# Patient Record
Sex: Male | Born: 1960 | Hispanic: Yes | Marital: Married | Smoking: Never smoker
Health system: Southern US, Community
[De-identification: ages and names within clinical notes are randomized; demographics above are authoritative.]

## PROBLEM LIST (undated history)

## (undated) ENCOUNTER — Ambulatory Visit: Admission: EM | Source: Home / Self Care

## (undated) DIAGNOSIS — G43909 Migraine, unspecified, not intractable, without status migrainosus: Secondary | ICD-10-CM

## (undated) DIAGNOSIS — I10 Essential (primary) hypertension: Secondary | ICD-10-CM

## (undated) HISTORY — DX: Migraine, unspecified, not intractable, without status migrainosus: G43.909

## (undated) HISTORY — DX: Essential (primary) hypertension: I10

---

## 2011-03-04 ENCOUNTER — Emergency Department: Payer: Self-pay | Admitting: Emergency Medicine

## 2014-12-25 ENCOUNTER — Ambulatory Visit: Payer: Self-pay | Admitting: Surgery

## 2015-11-26 IMAGING — US US EXTREM NON VASC*R* COMPLETE
1 series · 13 of 13 positions shown · non-contrast
Comparison: None.

CLINICAL DATA: Clinical right axillary lymphadenopathy ; evaluate
for soft tissue mass. ; history of infection in the muscles of the
upper right arm tube month ago treated with antibiotics

EXAM:
US EXTREM NON VASC*R* COMPLETE
TECHNIQUE: Grayscale techniques were employed to evaluate the soft tissues the
right axillary region.

[Series 1: us extrem non vasc*right* complete · 0.08mm/px · 13 of 13 slices shown]
[im 1/13]
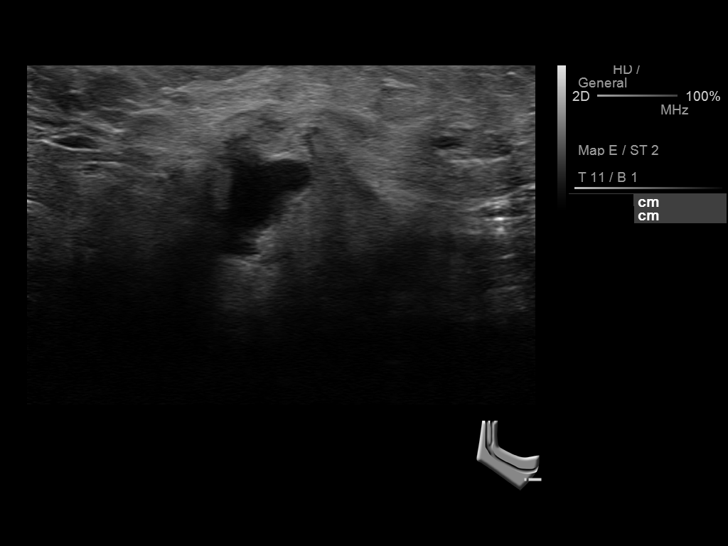
[im 2/13]
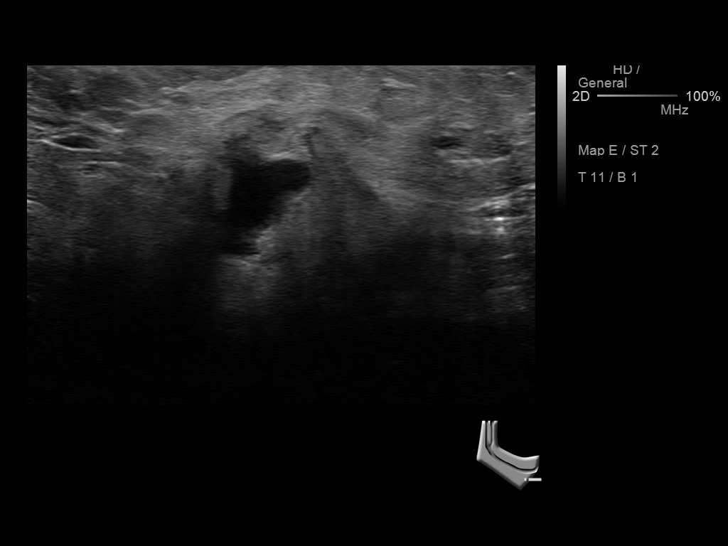
[im 3/13]
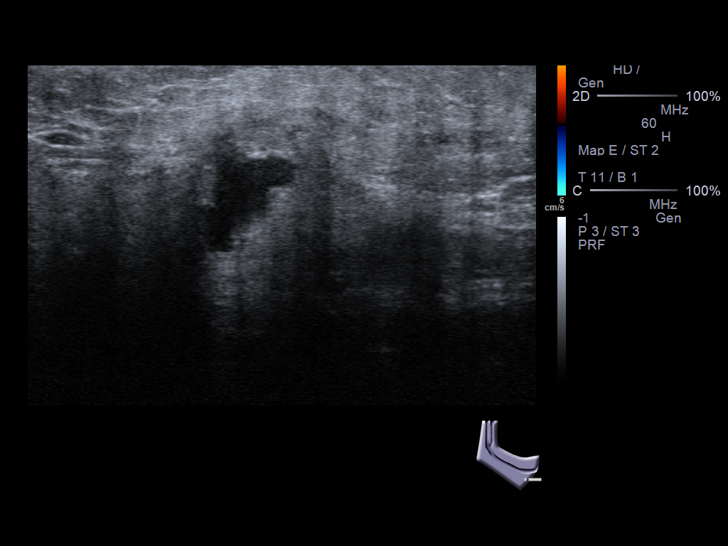
[im 4/13]
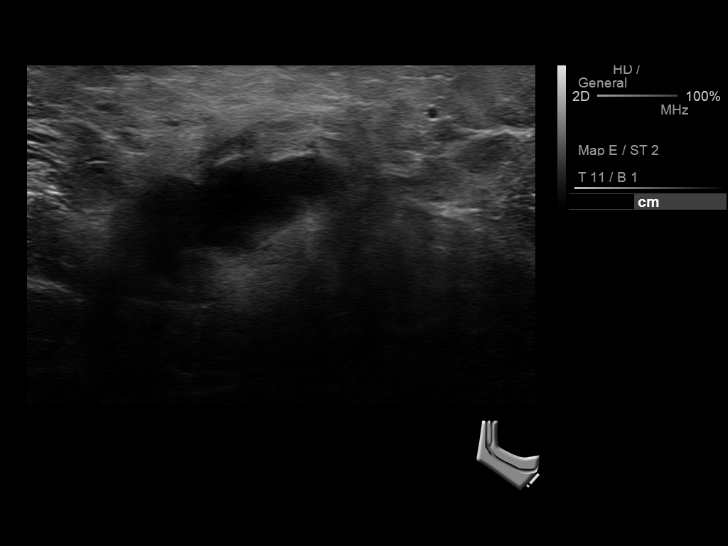
[im 5/13]
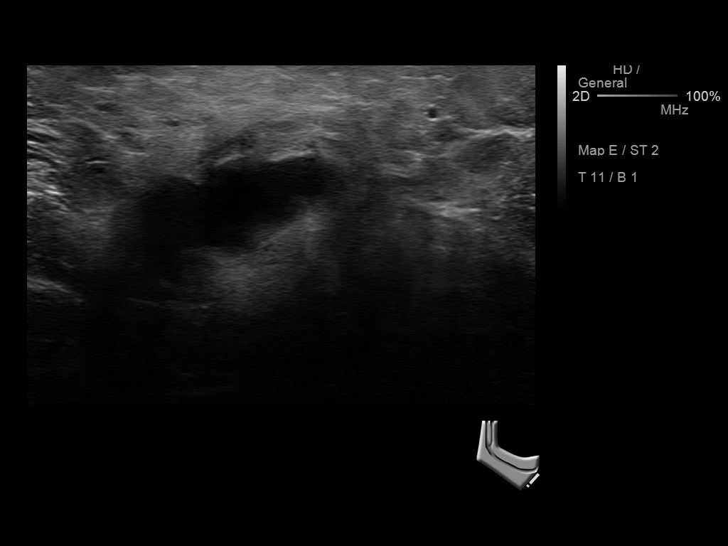
[im 6/13]
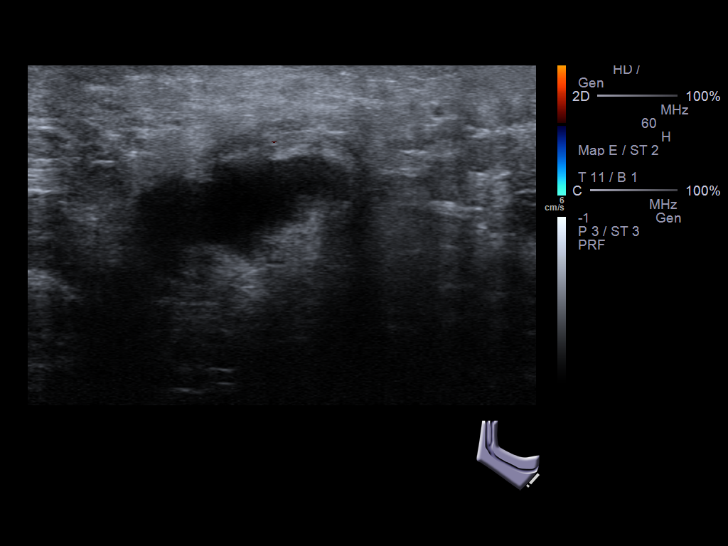
[im 7/13]
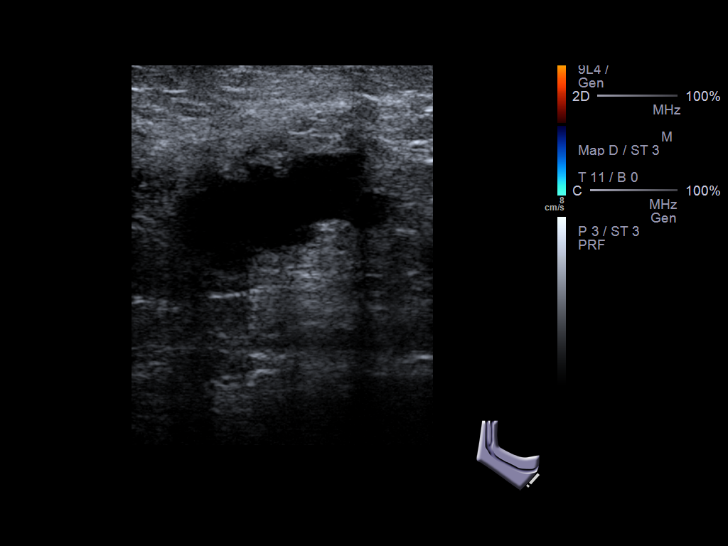
[im 8/13]
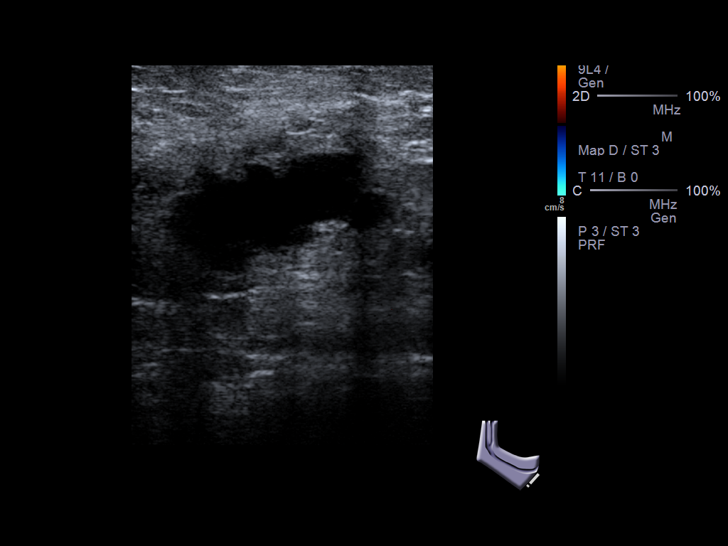
[im 9/13]
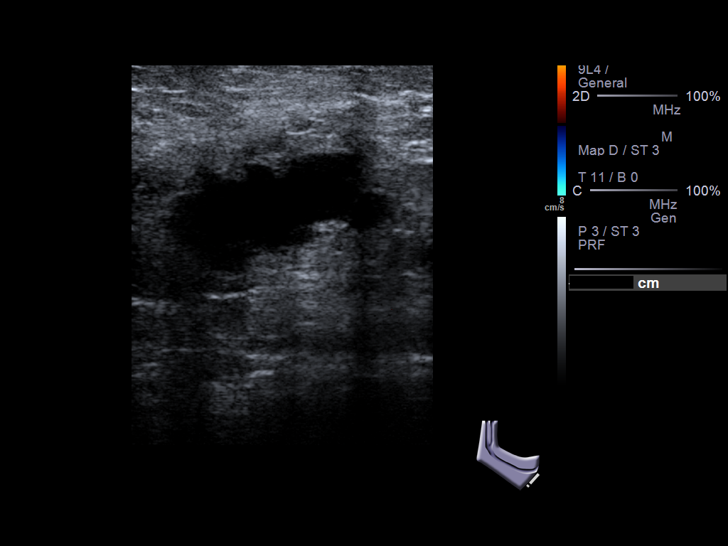
[im 10/13]
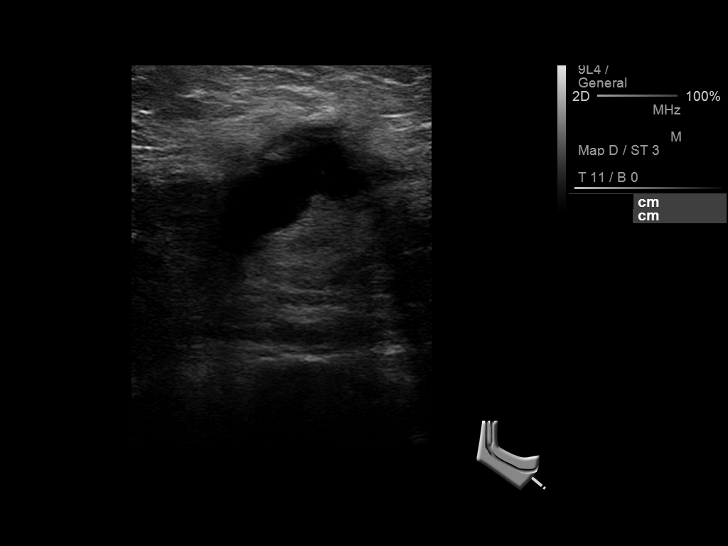
[im 11/13]
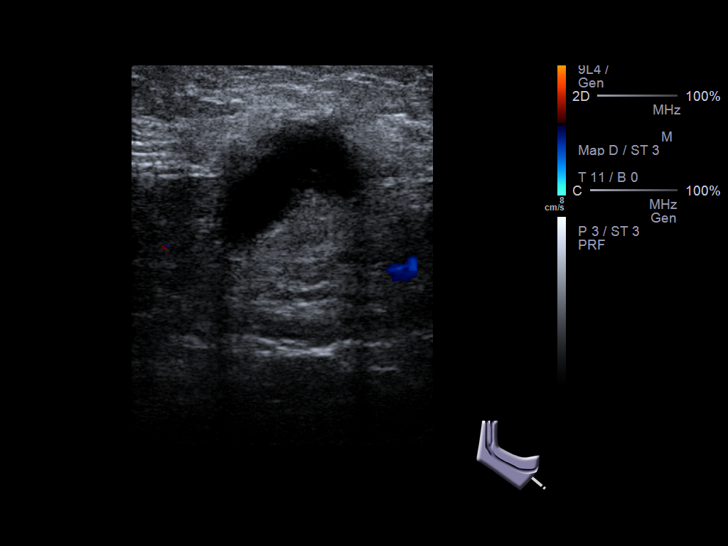
[im 12/13]
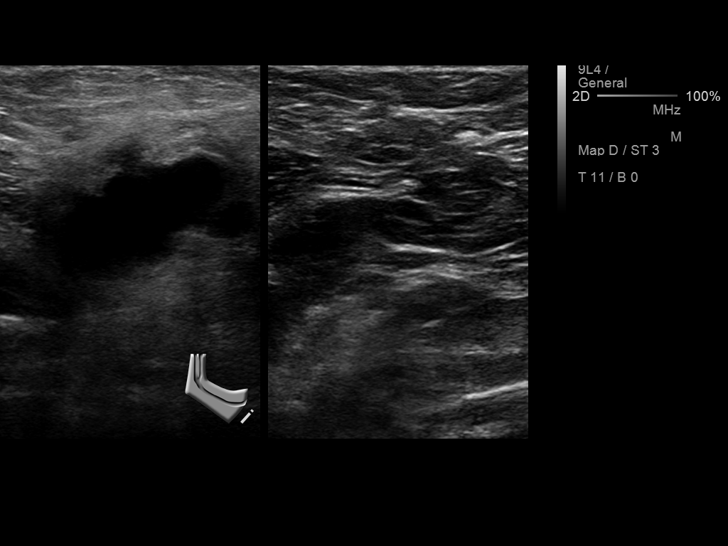
[im 13/13]
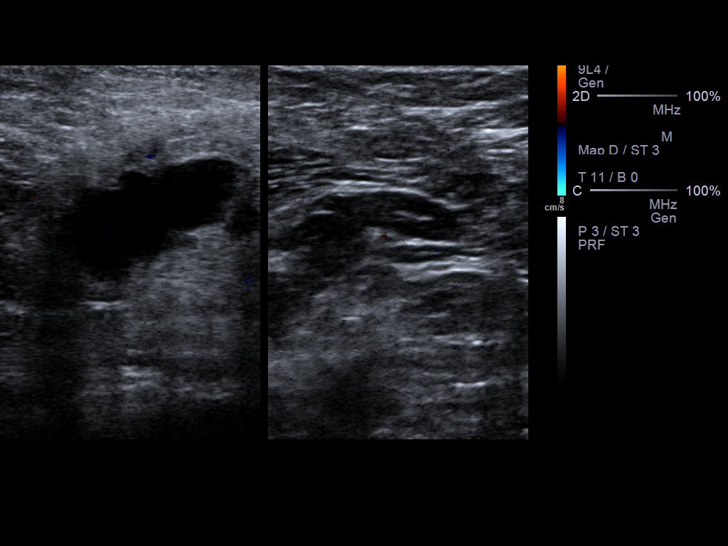

[13 of 13 positions shown; findings below may reference images not displayed]

FINDINGS: There is an irregularly marginated anechoic focus in the right
axilla exhibiting enhanced through transmission. This is deep to the
area of clinical swelling. It measures 1.9 x 1.7 x 2.3 cm. Increased
echotexture surrounding the structure is present. No internal echoes
are demonstrated.
IMPRESSION: Anechoic structure consistent with a fluid collection in the right
axilla. This likely reflects a seroma or old hematoma. No
suspicious-appearing solid masses are demonstrated.

## 2020-09-13 ENCOUNTER — Encounter: Payer: Self-pay | Admitting: Emergency Medicine

## 2020-09-13 ENCOUNTER — Emergency Department: Payer: 59

## 2020-09-13 ENCOUNTER — Emergency Department
Admission: EM | Admit: 2020-09-13 | Discharge: 2020-09-14 | Disposition: A | Discharge status: Home / Self Care | Payer: 59 | Attending: Emergency Medicine | Admitting: Emergency Medicine

## 2020-09-13 ENCOUNTER — Other Ambulatory Visit: Payer: Self-pay

## 2020-09-13 DIAGNOSIS — R001 Bradycardia, unspecified: Secondary | ICD-10-CM | POA: Insufficient documentation

## 2020-09-13 DIAGNOSIS — R079 Chest pain, unspecified: Secondary | ICD-10-CM | POA: Diagnosis not present

## 2020-09-13 DIAGNOSIS — R739 Hyperglycemia, unspecified: Secondary | ICD-10-CM | POA: Diagnosis not present

## 2020-09-13 DIAGNOSIS — I159 Secondary hypertension, unspecified: Secondary | ICD-10-CM

## 2020-09-13 LAB — BASIC METABOLIC PANEL
Anion gap: 13 (ref 5–15)
BUN: 17 mg/dL (ref 6–20)
CO2: 24 mmol/L (ref 22–32)
Calcium: 8.9 mg/dL (ref 8.9–10.3)
Chloride: 99 mmol/L (ref 98–111)
Creatinine, Ser: 0.97 mg/dL (ref 0.61–1.24)
GFR calc Af Amer: >60 mL/min (ref >60)
GFR calc non Af Amer: >60 mL/min (ref >60)
Glucose, Bld: 172 mg/dL — ABNORMAL HIGH (ref 70–99)
Potassium: 3.2 mmol/L — ABNORMAL LOW (ref 3.5–5.1)
Sodium: 136 mmol/L (ref 135–145)

## 2020-09-13 LAB — CBC
HCT: 43.6 % (ref 39.0–52.0)
Hemoglobin: 14.8 g/dL (ref 13.0–17.0)
MCH: 30.6 pg (ref 26.0–34.0)
MCHC: 33.9 g/dL (ref 30.0–36.0)
MCV: 90.3 fL (ref 80.0–100.0)
Platelets: 203 10*3/uL (ref 150–400)
RBC: 4.83 MIL/uL (ref 4.22–5.81)
RDW: 13.6 % (ref 11.5–15.5)
WBC: 6.7 10*3/uL (ref 4.0–10.5)
nRBC: 0 % (ref 0.0–0.2)

## 2020-09-13 LAB — TROPONIN I (HIGH SENSITIVITY)
Troponin I (High Sensitivity): 3 ng/L (ref <18)
Troponin I (High Sensitivity): 5 ng/L (ref <18)

## 2020-09-13 MED ORDER — POTASSIUM CHLORIDE CRYS ER 20 MEQ PO TBCR
40.0000 meq | EXTENDED_RELEASE_TABLET | Freq: Once | ORAL | Status: AC
  Administered 2020-09-14: 40 meq via ORAL
  Filled 2020-09-13: qty 2

## 2020-09-13 MED ORDER — LACTATED RINGERS IV BOLUS
1000.0000 mL | Freq: Once | INTRAVENOUS | Status: AC
  Administered 2020-09-14: 1000 mL via INTRAVENOUS

## 2020-09-13 NOTE — ED Provider Notes (Signed)
[facility] Emergency Department Provider Note  ____________________________________________   First MD Initiated Contact with Patient 09/13/20 2339     (approximate)  I have reviewed the triage vital signs and the nursing notes.   HISTORY  Chief Complaint Chest Pain   HPI Steven Baldwin is a 59 y.o. male without significant past medical history including no history of CAD, HTN, HDL, DM, tobacco abuse, or strong family history of CAD presents for assessment of approximately 2 months of intermittent left-sided chest pain.  Patient states this chest pain occurs every 3 to 4 days.  It is not exertional or positional and does occur at rest and when he is moving.  He denies any clear alleviating or aggravating factors other than aspirin which he states he will take sometimes when he has the pain and it seems to improve the pain.  He states that the pain that he felt today since the medication his usual but that it woke him out of sleep this morning.  He denies any other acute symptoms including headache, earache, sore throat, cough, shortness of breath, back pain, extremity pain, diaphoresis, nausea, vomiting, diarrhea, dysuria, abdominal pain, extremity pain, rash or other acute complaints.  Denies tobacco abuse, illegal drug use, or EtOH abuse.         History reviewed. No pertinent past medical history.  There are no problems to display for this patient.   History reviewed. No pertinent surgical history.  Prior to Admission medications   Not on File    Allergies Patient has no known allergies.  History reviewed. No pertinent family history.  Social History Social History   Tobacco Use  . Smoking status: Never Smoker  . Smokeless tobacco: Never Used  Substance Use Topics  . Alcohol use: Never  . Drug use: Never    Review of Systems  Review of Systems  Constitutional: Negative for chills and fever.  HENT: Negative for sore throat.     Eyes: Negative for pain.  Respiratory: Negative for cough and stridor.   Cardiovascular: Positive for chest pain.  Gastrointestinal: Negative for vomiting.  Skin: Negative for rash.  Neurological: Negative for seizures, loss of consciousness and headaches.  Psychiatric/Behavioral: Negative for suicidal ideas.  All other systems reviewed and are negative.     ____________________________________________   PHYSICAL EXAM:  VITAL SIGNS: ED Triage Vitals  Enc Vitals Group     BP 09/13/20 1639 135/74     Pulse Rate 09/13/20 1639 (!) 50     Resp 09/13/20 1639 16     Temp 09/13/20 1639 97.9 F (36.6 C)     Temp Source 09/13/20 1639 Oral     SpO2 09/13/20 1639 100 %     Weight 09/13/20 1638 180 lb (81.6 kg)     Height 09/13/20 1638 5\' 5"  (1.651 m)     Head Circumference --      Peak Flow --      Pain Score 09/13/20 1653 6     Pain Loc --      Pain Edu? --      Excl. in GC? --    Vitals:   09/13/20 2153 09/14/20 0024  BP: (!) 155/77 (!) 145/99  Pulse: (!) 49 (!) 43  Resp: 17 13  Temp: 98.1 F (36.7 C)   SpO2: 99% 98%   Physical Exam Vitals and nursing note reviewed.  Constitutional:      Appearance: He is well-developed.  HENT:  Head: Normocephalic and atraumatic.     Right Ear: External ear normal.     Left Ear: External ear normal.     Nose: Nose normal.     Mouth/Throat:     Mouth: Mucous membranes are moist.  Eyes:     Conjunctiva/sclera: Conjunctivae normal.  Cardiovascular:     Rate and Rhythm: Regular rhythm. Bradycardia present.     Heart sounds: No murmur heard.   Pulmonary:     Effort: Pulmonary effort is normal. No respiratory distress.     Breath sounds: Normal breath sounds.  Abdominal:     Palpations: Abdomen is soft.     Tenderness: There is no abdominal tenderness.  Musculoskeletal:     Cervical back: Neck supple.     Right lower leg: No edema.     Left lower leg: No edema.  Skin:    General: Skin is warm and dry.     Capillary  Refill: Capillary refill takes less than 2 seconds.  Neurological:     Mental Status: He is alert and oriented to person, place, and time.  Psychiatric:        Mood and Affect: Mood normal.      ____________________________________________   LABS (all labs ordered are listed, but only abnormal results are displayed)  Labs Reviewed  BASIC METABOLIC PANEL - Abnormal; Notable for the following components:      Result Value   Potassium 3.2 (*)    Glucose, Bld 172 (*)    All other components within normal limits  CBC  TSH  FIBRIN DERIVATIVES D-DIMER ([facility] ONLY)  HEMOGLOBIN A1C  TROPONIN I (HIGH SENSITIVITY)  TROPONIN I (HIGH SENSITIVITY)   ____________________________________________  EKG  Sinus bradycardia with a ventricular rate of 52, normal axis, prolonged PR interval at 202 representing first-degree block with otherwise unremarkable intervals and no clear evidence of acute ischemia or other significant underlying arrhythmia. ____________________________________________  RADIOLOGY  ED MD interpretation: No focal consolidation, effusion, no thorax, edema, or other acute thoracic process.  Official radiology report(s): DG Chest 2 View  Result Date: 09/13/2020 CLINICAL DATA:  Left-sided chest pain EXAM: CHEST - 2 VIEW COMPARISON:  None. FINDINGS: No focal consolidation. No pleural effusion or pneumothorax. Heart and mediastinal contours are unremarkable. No acute osseous abnormality. IMPRESSION: No acute cardiopulmonary disease. Electronically Signed   By: Elige Ko   On: 09/13/2020 17:45    ____________________________________________   PROCEDURES  Procedure(s) performed (including Critical Care):  .1-3 Lead EKG Interpretation Performed by: Gilles Chiquito, MD Authorized by: Gilles Chiquito, MD     Interpretation: abnormal     ECG rate assessment: bradycardic     Rhythm: A-V block     Ectopy: none     Conduction: abnormal     Abnormal conduction: 1st  degree AV block       ____________________________________________   INITIAL IMPRESSION / ASSESSMENT AND PLAN / ED COURSE        Patient presents with [place] to history exam for assessment of some chest pain that has been intermittent over the last 2 months.  Patient is noted to be bradycardic and slightly hypertensive with otherwise stable vital signs on arrival.  Differential includes ACS, arrhythmia, PE, pneumonia, GERD, chest wall etiologies including costochondritis and musculoskeletal strain, metabolic derangement, myocarditis, pericarditis, metabolic derangement.  Low suspicion for ACS at this time given no clear acute ischemic changes on ECG with 2 nonelevated troponins obtained over 2 hours.  Patient does not have a  first-degree AV block and bradycardia I suspect this is not related to patient's chest pain and not related to acute ischemia.  This is likely symptomatic patient denies any headache, lightheadedness, shortness of breath, or other symptoms could be related to his bradycardia.  Chest x-ray shows no evidence of pneumonia or other acute intrathoracic process.  D-dimer is less than 500 I will suspicion for PE at this time.  Patient is noted be hyperglycemic but no other significant metabolic or electrolyte derangements noted.  CBC is unremarkable.  Presentation work-up is not consistent with pericarditis, myocarditis, dissection, or reflux.  Patient was hydrated and given potassium.  On my assessment he stated he felt much better and is possible that mild dehydration and hypokalemia may have contributed to muscle spasm.  Emphasized to the patient my concerns for his high blood pressure and hyperglycemia today and I did instruct him that I sent an A1c test to test for diabetes but that this must be followed up by his PCP.  Instructed patient that he must follow-up with his PCP in the next 3 to 5 days and should return immediately to the emergency room should he experience any new or  acute worsening of his symptoms.  Given patient stating his chest pain had resolved and he assessment with otherwise stable vital signs and reassuring exam I do believe he is safe for discharge with plan for close outpatient follow-up.  Discharged stable condition.  Return precautions advised and discussed.   ____________________________________________   FINAL CLINICAL IMPRESSION(S) / ED DIAGNOSES  Final diagnoses:  Chest pain, unspecified type  Hyperglycemia  Secondary hypertension  Bradycardia    Medications  lactated ringers bolus 1,000 mL (1,000 mLs Intravenous New Bag/Given 09/14/20 0019)  potassium chloride SA (KLOR-CON) CR tablet 40 mEq (40 mEq Oral Given 09/14/20 0020)     ED Discharge Orders    None       Note:  This document was prepared using Dragon voice recognition software and may include unintentional dictation errors.   Gilles Chiquito, MD 09/14/20 [phone]

## 2020-09-13 NOTE — ED Triage Notes (Signed)
Here for left sided chest pain.  Pt reports pain woke him out of his sleep this AM.  Pt does not have regular doctor, unsure of baseline HR.  Describes as pressure or swelling to left chest.  Intermittent for 2 months.  Reports improvement with aspirin.

## 2020-09-14 ENCOUNTER — Other Ambulatory Visit: Payer: Self-pay

## 2020-09-14 LAB — HEMOGLOBIN A1C
Hgb A1c MFr Bld: 5.5 % (ref 4.8–5.6)
Mean Plasma Glucose: 111.15 mg/dL

## 2020-09-14 LAB — TSH: TSH: 1.809 u[IU]/mL (ref 0.350–4.500)

## 2020-09-14 LAB — FIBRIN DERIVATIVES D-DIMER (ARMC ONLY): Fibrin derivatives D-dimer (ARMC): 277.24 ng/mL (FEU) (ref 0.00–499.00)

## 2020-09-14 NOTE — ED Notes (Signed)
AVS provided in spanish and english per the pt's request

## 2022-06-06 ENCOUNTER — Ambulatory Visit (INDEPENDENT_AMBULATORY_CARE_PROVIDER_SITE_OTHER): Payer: 59 | Admitting: Internal Medicine

## 2022-06-06 ENCOUNTER — Encounter: Payer: Self-pay | Admitting: Internal Medicine

## 2022-06-06 ENCOUNTER — Ambulatory Visit: Payer: Self-pay | Admitting: Internal Medicine

## 2022-06-06 VITALS — BP 147/82 | HR 54 | Ht 67.0 in | Wt 185.5 lb

## 2022-06-06 DIAGNOSIS — G4733 Obstructive sleep apnea (adult) (pediatric): Secondary | ICD-10-CM | POA: Diagnosis not present

## 2022-06-06 DIAGNOSIS — E8881 Metabolic syndrome: Secondary | ICD-10-CM

## 2022-06-06 DIAGNOSIS — E669 Obesity, unspecified: Secondary | ICD-10-CM

## 2022-06-06 DIAGNOSIS — I10 Essential (primary) hypertension: Secondary | ICD-10-CM

## 2022-06-06 HISTORY — DX: Metabolic syndrome: E88.810

## 2022-06-06 MED ORDER — LOSARTAN POTASSIUM 50 MG PO TABS: 50.0000 mg | ORAL_TABLET | Freq: Every day | ORAL | 2 refills | Status: DC

## 2022-06-06 NOTE — Progress Notes (Signed)
New Patient Office Visit  Subjective:  Patient ID: Steven Baldwin, male    DOB: Nov 04, 1961  Age: 61 y.o. MRN: [id number]  CC:  Chief Complaint  Patient presents with   New Patient (Initial Visit)    HPI Patient presents for new pt visit  Past Medical History:  Diagnosis Date   Hypertension    Migraine headache     No current outpatient medications on file.   History reviewed. No pertinent surgical history.  Family History  Problem Relation Age of Onset   Hypertension Mother    Kidney disease Father    Hypertension Sister     Social History   Socioeconomic History   Marital status: Married    Spouse name: Not on file   Number of children: 3   Years of education: Not on file   Highest education level: Not on file  Occupational History   Not on file  Tobacco Use   Smoking status: Never   Smokeless tobacco: Never  Substance and Sexual Activity   Alcohol use: Never   Drug use: Never   Sexual activity: Not Currently    Birth control/protection: None  Other Topics Concern   Not on file  Social History Narrative   Not on file   Social Determinants of Health   Financial Resource Strain: Not on file  Food Insecurity: Not on file  Transportation Needs: Not on file  Physical Activity: Not on file  Stress: Not on file  Social Connections: Not on file  Intimate Partner Violence: Not on file    ROS Review of Systems  Constitutional: Negative.   HENT: Negative.    Eyes: Negative.   Respiratory: Negative.    Cardiovascular: Negative.   Gastrointestinal: Negative.   Endocrine: Negative.   Genitourinary: Negative.   Musculoskeletal: Negative.   Skin: Negative.   Allergic/Immunologic: Negative.   Neurological: Negative.   Hematological: Negative.   Psychiatric/Behavioral: Negative.    All other systems reviewed and are negative.   Objective:   Today's Vitals: BP (!) 147/82   Pulse (!) 54   Ht 5\' 7"  (1.702 m)   Wt 185 lb 8 oz (84.1 kg)   BMI  29.05 kg/m   Physical Exam Vitals reviewed.  Constitutional:      Appearance: Normal appearance. He is obese.  HENT:     Head: Normocephalic.     Nose: Nose normal.  Eyes:     Pupils: Pupils are equal, round, and reactive to light.  Cardiovascular:     Rate and Rhythm: Normal rate and regular rhythm.     Heart sounds: No murmur heard. Pulmonary:     Effort: Pulmonary effort is normal.  Abdominal:     General: Bowel sounds are normal.     Palpations: Abdomen is soft.  Musculoskeletal:        General: Normal range of motion.     Cervical back: Normal range of motion.  Skin:    General: Skin is warm.  Neurological:     General: No focal deficit present.     Mental Status: He is alert.     Assessment & Plan:   Problem List Items Addressed This Visit       Cardiovascular and Mediastinum   Primary hypertension - Primary    The following hypertensive lifestyle modification were recommended and discussed:  1. Limiting alcohol intake to less than 1 oz/day of ethanol:(24 oz of beer or 8 oz of wine or 2 oz of 100-proof whiskey).  2. Take baby ASA 81 mg daily. 3. Importance of regular aerobic exercise and losing weight. 4. Reduce dietary saturated fat and cholesterol intake for overall cardiovascular health. 5. Maintaining adequate dietary potassium, calcium, and magnesium intake. 6. Regular monitoring of the blood pressure. 7. Reduce sodium intake to less than 100 mmol/day (less than 2.3 gm of sodium or less than 6 gm of sodium choride)         Respiratory   OSA (obstructive sleep apnea)    Patient was suggested to have a sleep study done.  We will do a complete lab test PSA and thyroid check next week        Other   Abdominal obesity and metabolic syndrome    - I encouraged the patient to lose weight.  - I educated them on making healthy dietary choices including eating more fruits and vegetables and less fried foods. - I encouraged the patient to exercise more, and  educated on the benefits of exercise including weight loss, diabetes prevention, and hypertension prevention.   Dietary counseling with a registered dietician  Referral to a weight management support group (e.g. Weight Watchers, Overeaters Anonymous)  If your BMI is greater than 29 or you have gained more than 15 pounds you should work on weight loss.  Attend a healthy cooking class        No outpatient encounter medications on file as of 06/06/2022.   No facility-administered encounter medications on file as of 06/06/2022.    Follow-up: No follow-ups on file.   Corky Downs, MD

## 2022-06-06 NOTE — Assessment & Plan Note (Signed)

## 2022-06-06 NOTE — Assessment & Plan Note (Signed)

## 2022-06-06 NOTE — Assessment & Plan Note (Signed)
Patient was suggested to have a sleep study done.  We will do a complete lab test PSA and thyroid check next week

## 2022-06-07 ENCOUNTER — Ambulatory Visit: Payer: 59 | Admitting: Internal Medicine

## 2022-06-07 ENCOUNTER — Ambulatory Visit (INDEPENDENT_AMBULATORY_CARE_PROVIDER_SITE_OTHER): Payer: 59

## 2022-06-07 DIAGNOSIS — I10 Essential (primary) hypertension: Secondary | ICD-10-CM

## 2022-06-07 DIAGNOSIS — E8881 Metabolic syndrome: Secondary | ICD-10-CM

## 2022-06-07 DIAGNOSIS — E669 Obesity, unspecified: Secondary | ICD-10-CM

## 2022-06-08 LAB — CBC WITH DIFFERENTIAL/PLATELET
Absolute Monocytes: 459 cells/uL (ref 200–950)
Basophils Absolute: 39 cells/uL (ref 0–200)
Basophils Relative: 0.7 %
Eosinophils Absolute: 101 cells/uL (ref 15–500)
Eosinophils Relative: 1.8 %
HCT: 45.9 % (ref 38.5–50.0)
Hemoglobin: 15.5 g/dL (ref 13.2–17.1)
Lymphs Abs: 2089 cells/uL (ref 850–3900)
MCH: 31.1 pg (ref 27.0–33.0)
MCHC: 33.8 g/dL (ref 32.0–36.0)
MCV: 92 fL (ref 80.0–100.0)
MPV: 11.1 fL (ref 7.5–12.5)
Monocytes Relative: 8.2 %
Neutro Abs: 2912 cells/uL (ref 1500–7800)
Neutrophils Relative %: 52 %
Platelets: 223 10*3/uL (ref 140–400)
RBC: 4.99 10*6/uL (ref 4.20–5.80)
RDW: 13.4 % (ref 11.0–15.0)
Total Lymphocyte: 37.3 %
WBC: 5.6 10*3/uL (ref 3.8–10.8)

## 2022-06-08 LAB — LIPID PANEL
Cholesterol: 172 mg/dL (ref <200)
HDL: 32 mg/dL — ABNORMAL LOW (ref >=40)
LDL Cholesterol (Calc): 111 mg/dL (calc) — ABNORMAL HIGH
Non-HDL Cholesterol (Calc): 140 mg/dL (calc) — ABNORMAL HIGH (ref <130)
Total CHOL/HDL Ratio: 5.4 (calc) — ABNORMAL HIGH (ref <5.0)
Triglycerides: 172 mg/dL — ABNORMAL HIGH (ref <150)

## 2022-06-08 LAB — COMPLETE METABOLIC PANEL WITH GFR
AG Ratio: 1.5 (calc) (ref 1.0–2.5)
ALT: 18 U/L (ref 9–46)
AST: 20 U/L (ref 10–35)
Albumin: 4.3 g/dL (ref 3.6–5.1)
Alkaline phosphatase (APISO): 63 U/L (ref 35–144)
BUN: 15 mg/dL (ref 7–25)
CO2: 25 mmol/L (ref 20–32)
Calcium: 8.9 mg/dL (ref 8.6–10.3)
Chloride: 105 mmol/L (ref 98–110)
Creat: 0.98 mg/dL (ref 0.70–1.35)
Globulin: 2.8 g/dL (calc) (ref 1.9–3.7)
Glucose, Bld: 99 mg/dL (ref 65–99)
Potassium: 4.2 mmol/L (ref 3.5–5.3)
Sodium: 139 mmol/L (ref 135–146)
Total Bilirubin: 0.6 mg/dL (ref 0.2–1.2)
Total Protein: 7.1 g/dL (ref 6.1–8.1)
eGFR: 88 mL/min/{1.73_m2} (ref >=60)

## 2022-06-08 LAB — PSA: PSA: 0.97 ng/mL (ref <=4.00)

## 2022-06-08 LAB — TSH: TSH: 1.86 mIU/L (ref 0.40–4.50)

## 2022-06-08 LAB — SPECIMEN COMPROMISED

## 2022-06-14 ENCOUNTER — Encounter: Payer: Self-pay | Admitting: Internal Medicine

## 2022-06-14 ENCOUNTER — Ambulatory Visit (INDEPENDENT_AMBULATORY_CARE_PROVIDER_SITE_OTHER): Payer: 59 | Admitting: Internal Medicine

## 2022-06-14 VITALS — BP 120/77 | HR 60 | Ht 67.0 in | Wt 184.7 lb

## 2022-06-14 DIAGNOSIS — G4733 Obstructive sleep apnea (adult) (pediatric): Secondary | ICD-10-CM

## 2022-06-14 DIAGNOSIS — E8881 Metabolic syndrome: Secondary | ICD-10-CM | POA: Diagnosis not present

## 2022-06-14 DIAGNOSIS — G43709 Chronic migraine without aura, not intractable, without status migrainosus: Secondary | ICD-10-CM

## 2022-06-14 DIAGNOSIS — I10 Essential (primary) hypertension: Secondary | ICD-10-CM

## 2022-06-14 DIAGNOSIS — E669 Obesity, unspecified: Secondary | ICD-10-CM

## 2022-06-14 DIAGNOSIS — Z1211 Encounter for screening for malignant neoplasm of colon: Secondary | ICD-10-CM

## 2022-06-14 NOTE — Addendum Note (Signed)
Addended by: Jobie Quaker on: 06/14/2022 09:46 AM   Modules accepted: Orders

## 2022-06-14 NOTE — Assessment & Plan Note (Signed)

## 2022-06-14 NOTE — Assessment & Plan Note (Signed)
Stable at the present time. 

## 2022-06-14 NOTE — Assessment & Plan Note (Signed)

## 2022-06-14 NOTE — Assessment & Plan Note (Signed)
Patient does not use a CPAP

## 2022-06-14 NOTE — Progress Notes (Signed)
Established Patient Office Visit  Subjective:  Patient ID: Steven Baldwin, male    DOB: [birth year]  Age: 61 y.o. MRN: [id number]  CC:  Chief Complaint  Patient presents with   Follow-up    HPI  Steven Baldwin presents for check up  Past Medical History:  Diagnosis Date   Hypertension    Migraine headache     History reviewed. No pertinent surgical history.  Family History  Problem Relation Age of Onset   Hypertension Mother    Kidney disease Father    Hypertension Sister     Social History   Socioeconomic History   Marital status: Married    Spouse name: Not on file   Number of children: 3   Years of education: Not on file   Highest education level: Not on file  Occupational History   Not on file  Tobacco Use   Smoking status: Never   Smokeless tobacco: Never  Substance and Sexual Activity   Alcohol use: Never   Drug use: Never   Sexual activity: Not Currently    Birth control/protection: None  Other Topics Concern   Not on file  Social History Narrative   ** Merged History Encounter **       Social Determinants of Health   Financial Resource Strain: Not on file  Food Insecurity: Not on file  Transportation Needs: Not on file  Physical Activity: Not on file  Stress: Not on file  Social Connections: Not on file  Intimate Partner Violence: Not on file     Current Outpatient Medications:    losartan (COZAAR) 50 MG tablet, Take 1 tablet (50 mg total) by mouth daily., Disp: 90 tablet, Rfl: 2   No Known Allergies  ROS Review of Systems  Constitutional: Negative.   HENT: Negative.    Eyes: Negative.   Respiratory: Negative.    Cardiovascular: Negative.   Gastrointestinal: Negative.   Endocrine: Negative.   Genitourinary: Negative.   Musculoskeletal: Negative.   Skin: Negative.   Allergic/Immunologic: Negative.   Neurological: Negative.   Hematological: Negative.   Psychiatric/Behavioral: Negative.    All other systems reviewed and are  negative.     Objective:    Physical Exam Vitals reviewed.  Constitutional:      Appearance: Normal appearance.  HENT:     Mouth/Throat:     Mouth: Mucous membranes are moist.  Eyes:     Pupils: Pupils are equal, round, and reactive to light.  Neck:     Vascular: No carotid bruit.  Cardiovascular:     Rate and Rhythm: Normal rate and regular rhythm.     Pulses: Normal pulses.     Heart sounds: Normal heart sounds.  Pulmonary:     Effort: Pulmonary effort is normal.     Breath sounds: Normal breath sounds.  Abdominal:     General: Bowel sounds are normal.     Palpations: Abdomen is soft. There is no hepatomegaly, splenomegaly or mass.     Tenderness: There is no abdominal tenderness.     Hernia: No hernia is present.  Musculoskeletal:     Cervical back: Neck supple.     Right lower leg: No edema.     Left lower leg: No edema.  Skin:    Findings: No rash.  Neurological:     Mental Status: He is alert and oriented to person, place, and time.     Motor: No weakness.  Psychiatric:        Mood and  Affect: Mood normal.        Behavior: Behavior normal.     BP 120/77   Pulse 60   Ht 5\' 7"  (1.702 m)   Wt 184 lb 11.2 oz (83.8 kg)   BMI 28.93 kg/m  Wt Readings from Last 3 Encounters:  06/14/22 184 lb 11.2 oz (83.8 kg)  06/06/22 185 lb 8 oz (84.1 kg)  09/13/20 180 lb (81.6 kg)     Health Maintenance Due  Topic Date Due   HIV Screening  Never done   Hepatitis C Screening  Never done   TETANUS/TDAP  Never done   COLONOSCOPY (Pts 45-44yrs Insurance coverage will need to be confirmed)  Never done   Zoster Vaccines- Shingrix (1 of 2) Never done    There are no preventive care reminders to display for this patient.  Lab Results  Component Value Date   TSH 1.809 09/13/2020   Lab Results  Component Value Date   WBC 6.7 09/13/2020   HGB 14.8 09/13/2020   HCT 43.6 09/13/2020   MCV 90.3 09/13/2020   PLT 203 09/13/2020   Lab Results  Component Value Date   NA  136 09/13/2020   K 3.2 (L) 09/13/2020   CO2 24 09/13/2020   GLUCOSE 172 (H) 09/13/2020   BUN 17 09/13/2020   CREATININE 0.97 09/13/2020   CALCIUM 8.9 09/13/2020   ANIONGAP 13 09/13/2020   No results found for: "CHOL" No results found for: "HDL" No results found for: "LDLCALC" No results found for: "TRIG" No results found for: "CHOLHDL" Lab Results  Component Value Date   HGBA1C 5.5 09/13/2020      Assessment & Plan:   Problem List Items Addressed This Visit       Cardiovascular and Mediastinum   Primary hypertension - Primary    The following hypertensive lifestyle modification were recommended and discussed:  1. Limiting alcohol intake to less than 1 oz/day of ethanol:(24 oz of beer or 8 oz of wine or 2 oz of 100-proof whiskey). 2. Take baby ASA 81 mg daily. 3. Importance of regular aerobic exercise and losing weight. 4. Reduce dietary saturated fat and cholesterol intake for overall cardiovascular health. 5. Maintaining adequate dietary potassium, calcium, and magnesium intake. 6. Regular monitoring of the blood pressure. 7. Reduce sodium intake to less than 100 mmol/day (less than 2.3 gm of sodium or less than 6 gm of sodium choride)       Chronic migraine without aura without status migrainosus, not intractable    Stable at the present time        Respiratory   OSA (obstructive sleep apnea)    Patient does not use a CPAP        Other   Abdominal obesity and metabolic syndrome    - I encouraged the patient to lose weight.  - I educated them on making healthy dietary choices including eating more fruits and vegetables and less fried foods. - I encouraged the patient to exercise more, and educated on the benefits of exercise including weight loss, diabetes prevention, and hypertension prevention.   Dietary counseling with a registered dietician  Referral to a weight management support group (e.g. Weight Watchers, Overeaters Anonymous)  If your BMI is greater  than 29 or you have gained more than 15 pounds you should work on weight loss.  Attend a healthy cooking class       Patient was suggested to have a colonoscopy shingles shot was advised to lose weight. No  orders of the defined types were placed in this encounter.   Follow-up: No follow-ups on file.    Cletis Athens, MD

## 2022-06-15 ENCOUNTER — Telehealth: Payer: Self-pay

## 2022-06-15 NOTE — Telephone Encounter (Signed)
CALLED PATIENT NO ANSWER LEFT VOICEMAIL FOR A CALL BACK ? ?

## 2022-06-20 ENCOUNTER — Telehealth: Payer: Self-pay

## 2022-07-04 ENCOUNTER — Ambulatory Visit: Payer: Self-pay | Admitting: Internal Medicine

## 2022-07-05 ENCOUNTER — Ambulatory Visit: Payer: Self-pay | Admitting: Internal Medicine

## 2022-11-01 ENCOUNTER — Ambulatory Visit: Payer: 59 | Admitting: Internal Medicine

## 2022-11-03 ENCOUNTER — Ambulatory Visit (INDEPENDENT_AMBULATORY_CARE_PROVIDER_SITE_OTHER): Payer: 59 | Admitting: Nurse Practitioner

## 2022-11-03 ENCOUNTER — Encounter: Payer: Self-pay | Admitting: Nurse Practitioner

## 2022-11-03 VITALS — BP 127/77 | HR 63 | Temp 97.6°F | Ht 67.0 in | Wt 184.2 lb

## 2022-11-03 DIAGNOSIS — E785 Hyperlipidemia, unspecified: Secondary | ICD-10-CM | POA: Diagnosis not present

## 2022-11-03 DIAGNOSIS — G4700 Insomnia, unspecified: Secondary | ICD-10-CM

## 2022-11-03 DIAGNOSIS — I10 Essential (primary) hypertension: Secondary | ICD-10-CM | POA: Diagnosis not present

## 2022-11-03 DIAGNOSIS — G43709 Chronic migraine without aura, not intractable, without status migrainosus: Secondary | ICD-10-CM

## 2022-11-03 NOTE — Progress Notes (Signed)
Established Patient Office Visit  Subjective:  Patient ID: Steven Baldwin, male    DOB: [birth year]  Age: 61 y.o. MRN: [id number]  CC:  Chief Complaint  Patient presents with   Hypertension     HPI  Steven Baldwin presents for hypertension follow up. He takes losartan 50 mg. Denies headache, chest pain and SOB. Patient complaints of some sleeping issues off in the office today and on.    Past Medical History:  Diagnosis Date   Hypertension    Migraine headache     No past surgical history on file.  Family History  Problem Relation Age of Onset   Hypertension Mother    Kidney disease Father    Hypertension Sister     Social History   Socioeconomic History   Marital status: Married    Spouse name: Not on file   Number of children: 3   Years of education: Not on file   Highest education level: Not on file  Occupational History   Not on file  Tobacco Use   Smoking status: Never   Smokeless tobacco: Never  Substance and Sexual Activity   Alcohol use: Never   Drug use: Never   Sexual activity: Not Currently    Birth control/protection: None  Other Topics Concern   Not on file  Social History Narrative   ** Merged History Encounter **       Social Determinants of Health   Financial Resource Strain: Not on file  Food Insecurity: Not on file  Transportation Needs: Not on file  Physical Activity: Not on file  Stress: Not on file  Social Connections: Not on file  Intimate Partner Violence: Not on file     Outpatient Medications Prior to Visit  Medication Sig Dispense Refill   losartan (COZAAR) 50 MG tablet Take 1 tablet (50 mg total) by mouth daily. 90 tablet 2   No facility-administered medications prior to visit.    No Known Allergies  ROS Review of Systems  Constitutional: Negative.   HENT: Negative.    Eyes: Negative.   Respiratory: Negative.    Cardiovascular: Negative.   Gastrointestinal: Negative.   Genitourinary: Negative.    Musculoskeletal: Negative.   Neurological: Negative.   Hematological: Negative.   Psychiatric/Behavioral: Negative.        Objective:    Physical Exam Constitutional:      Appearance: He is overweight.  HENT:     Head: Normocephalic.     Right Ear: Tympanic membrane normal.     Left Ear: Tympanic membrane normal.     Nose: Nose normal.  Eyes:     Extraocular Movements: Extraocular movements intact.     Conjunctiva/sclera: Conjunctivae normal.     Pupils: Pupils are equal, round, and reactive to light.  Cardiovascular:     Rate and Rhythm: Normal rate and regular rhythm.     Pulses: Normal pulses.     Heart sounds: Normal heart sounds.  Pulmonary:     Effort: Pulmonary effort is normal.     Breath sounds: Normal breath sounds.  Abdominal:     General: Bowel sounds are normal. There is no distension.     Palpations: Abdomen is soft.     Hernia: No hernia is present.  Musculoskeletal:        General: Normal range of motion.  Skin:    General: Skin is warm.     Capillary Refill: Capillary refill takes less than 2 seconds.  Neurological:  General: No focal deficit present.     Mental Status: He is alert and oriented to person, place, and time. Mental status is at baseline.  Psychiatric:        Mood and Affect: Mood normal.        Behavior: Behavior normal.        Thought Content: Thought content normal.        Judgment: Judgment normal.     BP 127/77   Pulse 63   Temp 97.6 F (36.4 C) (Temporal)   Ht _0  (1.702 m)   Wt 184 lb 3.2 oz (83.6 kg)   SpO2 96%   BMI 28.85 kg/m  Wt Readings from Last 3 Encounters:  11/03/22 184 lb 3.2 oz (83.6 kg)  06/14/22 184 lb 11.2 oz (83.8 kg)  06/06/22 185 lb 8 oz (84.1 kg)     Health Maintenance  Topic Date Due   COVID-19 Vaccine (1) Never done   HIV Screening  Never done   Hepatitis C Screening  Never done   DTaP/Tdap/Td (1 - Tdap) Never done   Zoster Vaccines- Shingrix (1 of 2) 02/03/2023 (Originally 03/25/2012)    INFLUENZA VACCINE  03/26/2023 (Originally 07/26/2022)   COLONOSCOPY (Pts 45-54yr Insurance coverage will need to be confirmed)  11/04/2023 (Originally 03/26/2007)   HPV VACCINES  Aged Out    There are no preventive care reminders to display for this patient.  Lab Results  Component Value Date   TSH 1.86 06/07/2022   Lab Results  Component Value Date   WBC 5.6 06/07/2022   HGB 15.5 06/07/2022   HCT 45.9 06/07/2022   MCV 92.0 06/07/2022   PLT 223 06/07/2022   Lab Results  Component Value Date   NA 139 06/07/2022   K 4.2 06/07/2022   CO2 25 06/07/2022   GLUCOSE 99 06/07/2022   BUN 15 06/07/2022   CREATININE 0.98 06/07/2022   BILITOT 0.6 06/07/2022   AST 20 06/07/2022   ALT 18 06/07/2022   PROT 7.1 06/07/2022   CALCIUM 8.9 06/07/2022   ANIONGAP 13 09/13/2020   EGFR 88 06/07/2022   Lab Results  Component Value Date   CHOL 172 06/07/2022   Lab Results  Component Value Date   HDL 32 (L) 06/07/2022   Lab Results  Component Value Date   LDLCALC 111 (H) 06/07/2022   Lab Results  Component Value Date   TRIG 172 (H) 06/07/2022   Lab Results  Component Value Date   CHOLHDL 5.4 (H) 06/07/2022   Lab Results  Component Value Date   HGBA1C 5.5 09/13/2020      Assessment & Plan:   Problem List Items Addressed This Visit       Cardiovascular and Mediastinum   Primary hypertension - Primary    Patient BP  Vitals:   11/03/22 1613  BP: 127/77    in the office today. Advised pt to follow a low salt and heart healthy diet. Continue losartan 50 mg daily.         Other   Insomnia    Educated patient regarding good sleep hygiene. Advised him to take melatonin over-the-counter. We will continue to monitor      Hyperlipidemia    Patient have elevated triglyceride,  LDL level and low HDL level on 06/07/2022. Encourage patient to eat healthy and follow regular exercise schedule. We will check lipids during the next visit. If needed would start him on  medication.        No orders  of the defined types were placed in this encounter.    Follow-up: No follow-ups on file.    Theresia Lo, NP

## 2022-11-24 ENCOUNTER — Encounter: Payer: Self-pay | Admitting: Nurse Practitioner

## 2022-11-24 DIAGNOSIS — E785 Hyperlipidemia, unspecified: Secondary | ICD-10-CM | POA: Insufficient documentation

## 2022-11-24 NOTE — Assessment & Plan Note (Signed)
Patient have elevated triglyceride,  LDL level and low HDL level on 06/07/2022. Encourage patient to eat healthy and follow regular exercise schedule. We will check lipids during the next visit. If needed would start him on medication.

## 2022-11-24 NOTE — Assessment & Plan Note (Signed)
Patient BP  Vitals:   11/03/22 1613  BP: 127/77    in the office today. Advised pt to follow a low salt and heart healthy diet. Continue losartan 50 mg daily.

## 2022-11-24 NOTE — Assessment & Plan Note (Addendum)
Educated patient regarding good sleep hygiene. Advised him to take melatonin over-the-counter. We will continue to monitor

## 2023-03-03 ENCOUNTER — Ambulatory Visit: Payer: 59 | Admitting: Nurse Practitioner

## 2024-06-14 ENCOUNTER — Ambulatory Visit: Admitting: Physician Assistant

## 2024-06-24 ENCOUNTER — Ambulatory Visit (INDEPENDENT_AMBULATORY_CARE_PROVIDER_SITE_OTHER): Admitting: Physician Assistant

## 2024-06-24 ENCOUNTER — Encounter: Payer: Self-pay | Admitting: Physician Assistant

## 2024-06-24 VITALS — BP 132/64 | HR 61 | Temp 98.4°F | Ht 67.0 in | Wt 171.0 lb

## 2024-06-24 DIAGNOSIS — E782 Mixed hyperlipidemia: Secondary | ICD-10-CM

## 2024-06-24 DIAGNOSIS — Z1159 Encounter for screening for other viral diseases: Secondary | ICD-10-CM

## 2024-06-24 DIAGNOSIS — Z23 Encounter for immunization: Secondary | ICD-10-CM

## 2024-06-24 DIAGNOSIS — Z1211 Encounter for screening for malignant neoplasm of colon: Secondary | ICD-10-CM

## 2024-06-24 DIAGNOSIS — Z125 Encounter for screening for malignant neoplasm of prostate: Secondary | ICD-10-CM

## 2024-06-24 DIAGNOSIS — I10 Essential (primary) hypertension: Secondary | ICD-10-CM | POA: Diagnosis not present

## 2024-06-24 DIAGNOSIS — J342 Deviated nasal septum: Secondary | ICD-10-CM | POA: Insufficient documentation

## 2024-06-24 DIAGNOSIS — Z0000 Encounter for general adult medical examination without abnormal findings: Secondary | ICD-10-CM

## 2024-06-24 DIAGNOSIS — Z114 Encounter for screening for human immunodeficiency virus [HIV]: Secondary | ICD-10-CM

## 2024-06-24 MED ORDER — LOSARTAN POTASSIUM 50 MG PO TABS: 50.0000 mg | ORAL_TABLET | Freq: Every day | ORAL | 2 refills | Status: AC

## 2024-06-24 NOTE — Progress Notes (Signed)
 Date:  06/24/2024   Name:  Steven Baldwin   DOB:  06/09/1961   MRN:  [id number]   Chief Complaint: Establish Care and Hypertension (Hasn't been on BP medication for a year)  HPI Steven Baldwin is a very pleasant 63 y.o. male with hx HTN who presents new to the clinic today to establish care. He states that over the course of the last 6-12 months, he has lost about 20 lbs by limiting carbohydrates from sugar-sweetened beverages and other unhealthy foods. He is due for routine physical. Requests refill of losartan .   Last Physical: unknown, had last OV 11/03/22 Last Dental Exam: 2 months ago Last Eye Exam: unknown Last CRC screen: never Last PSA: 0.97 on 06/07/22 Immunizations Due: Shingrix, Tdap   Medication list has been reviewed and updated.  Current Meds  Medication Sig   [DISCONTINUED] losartan  (COZAAR ) 50 MG tablet Take 1 tablet (50 mg total) by mouth daily.     Review of Systems  Patient Active Problem List   Diagnosis Date Noted   Deviated nasal septum 06/24/2024   Hyperlipidemia 11/24/2022   Insomnia 11/03/2022   Chronic migraine without aura without status migrainosus, not intractable 06/14/2022   Primary hypertension 06/06/2022   Abdominal obesity and metabolic syndrome 06/06/2022   OSA (obstructive sleep apnea) 06/06/2022    No Known Allergies  Immunization History  Administered Date(s) Administered   Tdap 06/24/2024   Zoster Recombinant(Shingrix) 06/24/2024    History reviewed. No pertinent surgical history.  Social History   Tobacco Use   Smoking status: Never   Smokeless tobacco: Never  Vaping Use   Vaping status: Never Used  Substance Use Topics   Alcohol use: Never   Drug use: Never    Family History  Problem Relation Age of Onset   Hypertension Mother    Kidney disease Father    Hypertension Sister         06/24/2024    1:22 PM  GAD 7 : Generalized Anxiety Score  Nervous, Anxious, on Edge 0  Control/stop worrying 0  Worry too much -  different things 0  Trouble relaxing 0  Restless 0  Easily annoyed or irritable 0  Afraid - awful might happen 0  Total GAD 7 Score 0  Anxiety Difficulty Not difficult at all       06/24/2024    1:22 PM 06/06/2022   10:59 AM  Depression screen PHQ 2/9  Decreased Interest 0 0  Down, Depressed, Hopeless 0 0  PHQ - 2 Score 0 0    BP Readings from Last 3 Encounters:  06/24/24 132/64  11/03/22 127/77  06/14/22 120/77    Wt Readings from Last 3 Encounters:  06/24/24 171 lb (77.6 kg)  11/03/22 184 lb 3.2 oz (83.6 kg)  06/14/22 184 lb 11.2 oz (83.8 kg)    BP 132/64 (Cuff Size: Large)   Pulse 61   Temp 98.4 F (36.9 C)   Ht 5' 7 (1.702 m)   Wt 171 lb (77.6 kg)   SpO2 96%   BMI 26.78 kg/m   Physical Exam Vitals and nursing note reviewed.  Constitutional:      Appearance: Normal appearance.  HENT:     Ears:     Comments: EAC clear bilaterally with good view of TM which is without effusion or erythema.     Nose: Septal deviation present.     Mouth/Throat:     Mouth: Mucous membranes are moist. No oral lesions.     Dentition:  Normal dentition.     Pharynx: No posterior oropharyngeal erythema.   Eyes:     Extraocular Movements: Extraocular movements intact.     Conjunctiva/sclera: Conjunctivae normal.     Pupils: Pupils are equal, round, and reactive to light.   Neck:     Thyroid : No thyromegaly.   Cardiovascular:     Rate and Rhythm: Normal rate and regular rhythm.     Heart sounds: No murmur heard.    No friction rub. No gallop.     Comments: Pulses 2+ at radial, PT, DP bilaterally. No carotid bruit. No peripheral edema Pulmonary:     Effort: Pulmonary effort is normal.     Breath sounds: Normal breath sounds.  Abdominal:     General: Bowel sounds are normal.     Palpations: Abdomen is soft. There is no mass.     Tenderness: There is no abdominal tenderness.  Genitourinary:    Prostate: Normal. Not enlarged, not tender and no nodules present.     Rectum:  Normal. Guaiac result negative. No mass.   Musculoskeletal:     Comments: Full ROM with strength 5/5 bilateral upper and lower extremities  Lymphadenopathy:     Cervical: No cervical adenopathy.   Skin:    General: Skin is warm.     Capillary Refill: Capillary refill takes less than 2 seconds.     Findings: No lesion or rash.   Neurological:     Mental Status: He is alert and oriented to person, place, and time.     Gait: Gait is intact.   Psychiatric:        Mood and Affect: Mood normal.        Behavior: Behavior normal.     Recent Labs     Component Value Date/Time   NA 139 06/07/2022 0829   K 4.2 06/07/2022 0829   CL 105 06/07/2022 0829   CO2 25 06/07/2022 0829   GLUCOSE 99 06/07/2022 0829   BUN 15 06/07/2022 0829   CREATININE 0.98 06/07/2022 0829   CALCIUM 8.9 06/07/2022 0829   PROT 7.1 06/07/2022 0829   AST 20 06/07/2022 0829   ALT 18 06/07/2022 0829   BILITOT 0.6 06/07/2022 0829   GFRNONAA >60 09/13/2020 1640   GFRAA >60 09/13/2020 1640    Lab Results  Component Value Date   WBC 5.6 06/07/2022   HGB 15.5 06/07/2022   HCT 45.9 06/07/2022   MCV 92.0 06/07/2022   PLT 223 06/07/2022   Lab Results  Component Value Date   HGBA1C 5.5 09/13/2020   Lab Results  Component Value Date   CHOL 172 06/07/2022   HDL 32 (L) 06/07/2022   LDLCALC 111 (H) 06/07/2022   TRIG 172 (H) 06/07/2022   CHOLHDL 5.4 (H) 06/07/2022   Lab Results  Component Value Date   TSH 1.86 06/07/2022     Assessment and Plan:  1. Annual physical exam (Primary) Seemingly healthy patient with no abnormalities on exam. Encouraged healthy lifestyle including regular physical activity and consumption of whole fruits and vegetables. Encouraged routine dental and eye exams.   - CBC with Differential/Platelet - Comprehensive metabolic panel with GFR - TSH - Lipid panel - PSA - Hepatitis C antibody - HIV Antibody (routine testing w rflx)  2. Primary hypertension Refill losartan , check  routine labs  - losartan  (COZAAR ) 50 MG tablet; Take 1 tablet (50 mg total) by mouth daily.  Dispense: 90 tablet; Refill: 2 - CBC with Differential/Platelet - Comprehensive metabolic panel with  GFR - TSH - Lipid panel  3. Mixed hyperlipidemia Check nonfasting lipids - Lipid panel  4. Encounter for immunization Tdap and Shingrix #1 given today. Side effects reviewed.   - Tdap vaccine greater than or equal to 7yo IM - Varicella-zoster vaccine IM  5. Screening PSA (prostate specific antigen) Normal prostate exam, check PSA as well - PSA  6. Screening for colon cancer Due to long delay in starting CRC screen, will be sending for colonoscopy. - Ambulatory referral to Gastroenterology  7. Deviated nasal septum Incidental finding, not bothersome to patient.   8. Screening for HIV (human immunodeficiency virus)  9. Need for hepatitis C screening test    Return in 2 months nurse visit Shingrix #2 Return in about 4 months (around 10/24/2024) for OV f/u chronic conditions.    Rolan Hoyle, PA-C, DMSc, Nutritionist [facility] [phone]

## 2024-06-25 ENCOUNTER — Ambulatory Visit: Payer: Self-pay | Admitting: Physician Assistant

## 2024-06-25 LAB — TSH: TSH: 1.84 u[IU]/mL (ref 0.450–4.500)

## 2024-06-25 LAB — LIPID PANEL
Chol/HDL Ratio: 5.9 ratio — ABNORMAL HIGH (ref 0.0–5.0)
Cholesterol, Total: 189 mg/dL (ref 100–199)
HDL: 32 mg/dL — ABNORMAL LOW (ref >39)
LDL Chol Calc (NIH): 120 mg/dL — ABNORMAL HIGH (ref 0–99)
Triglycerides: 208 mg/dL — ABNORMAL HIGH (ref 0–149)
VLDL Cholesterol Cal: 37 mg/dL (ref 5–40)

## 2024-06-25 LAB — CBC WITH DIFFERENTIAL/PLATELET
Basophils Absolute: 0 10*3/uL (ref 0.0–0.2)
Basos: 1 %
EOS (ABSOLUTE): 0.1 10*3/uL (ref 0.0–0.4)
Eos: 2 %
Hematocrit: 45.4 % (ref 37.5–51.0)
Hemoglobin: 14.6 g/dL (ref 13.0–17.7)
Immature Grans (Abs): 0 10*3/uL (ref 0.0–0.1)
Immature Granulocytes: 0 %
Lymphocytes Absolute: 1.6 10*3/uL (ref 0.7–3.1)
Lymphs: 27 %
MCH: 29.9 pg (ref 26.6–33.0)
MCHC: 32.2 g/dL (ref 31.5–35.7)
MCV: 93 fL (ref 79–97)
Monocytes Absolute: 0.5 10*3/uL (ref 0.1–0.9)
Monocytes: 8 %
Neutrophils Absolute: 3.7 10*3/uL (ref 1.4–7.0)
Neutrophils: 62 %
Platelets: 207 10*3/uL (ref 150–450)
RBC: 4.89 x10E6/uL (ref 4.14–5.80)
RDW: 13.8 % (ref 11.6–15.4)
WBC: 5.9 10*3/uL (ref 3.4–10.8)

## 2024-06-25 LAB — COMPREHENSIVE METABOLIC PANEL WITH GFR
ALT: 23 IU/L (ref 0–44)
AST: 26 IU/L (ref 0–40)
Albumin: 4.7 g/dL (ref 3.9–4.9)
Alkaline Phosphatase: 73 IU/L (ref 44–121)
BUN/Creatinine Ratio: 21 (ref 10–24)
BUN: 18 mg/dL (ref 8–27)
Bilirubin Total: 0.4 mg/dL (ref 0.0–1.2)
CO2: 21 mmol/L (ref 20–29)
Calcium: 9.2 mg/dL (ref 8.6–10.2)
Chloride: 103 mmol/L (ref 96–106)
Creatinine, Ser: 0.84 mg/dL (ref 0.76–1.27)
Globulin, Total: 2.5 g/dL (ref 1.5–4.5)
Glucose: 107 mg/dL — ABNORMAL HIGH (ref 70–99)
Potassium: 4 mmol/L (ref 3.5–5.2)
Sodium: 140 mmol/L (ref 134–144)
Total Protein: 7.2 g/dL (ref 6.0–8.5)
eGFR: 99 mL/min/{1.73_m2} (ref >59)

## 2024-06-25 LAB — PSA: Prostate Specific Ag, Serum: 2.9 ng/mL (ref 0.0–4.0)

## 2024-06-25 LAB — HIV ANTIBODY (ROUTINE TESTING W REFLEX): HIV Screen 4th Generation wRfx: NONREACTIVE

## 2024-06-25 LAB — HEPATITIS C ANTIBODY: Hep C Virus Ab: NONREACTIVE

## 2024-08-19 ENCOUNTER — Ambulatory Visit (INDEPENDENT_AMBULATORY_CARE_PROVIDER_SITE_OTHER)

## 2024-08-19 DIAGNOSIS — Z23 Encounter for immunization: Secondary | ICD-10-CM | POA: Diagnosis not present

## 2024-08-19 NOTE — Progress Notes (Signed)
 Patient presented today for his second Shingrix  vaccine. Administered into left deltoid. Patient tolerated well.  JM

## 2024-10-24 ENCOUNTER — Encounter: Payer: Self-pay | Admitting: Physician Assistant

## 2024-10-24 ENCOUNTER — Ambulatory Visit: Admitting: Physician Assistant

## 2024-10-24 VITALS — BP 140/80 | HR 54 | Temp 98.5°F | Ht 67.0 in | Wt 168.0 lb

## 2024-10-24 DIAGNOSIS — Z1211 Encounter for screening for malignant neoplasm of colon: Secondary | ICD-10-CM | POA: Diagnosis not present

## 2024-10-24 DIAGNOSIS — E782 Mixed hyperlipidemia: Secondary | ICD-10-CM | POA: Diagnosis not present

## 2024-10-24 DIAGNOSIS — I10 Essential (primary) hypertension: Secondary | ICD-10-CM

## 2024-10-24 NOTE — Assessment & Plan Note (Signed)
Check fasting lipids today 

## 2024-10-24 NOTE — Assessment & Plan Note (Signed)
 Continue losartan  50 mg daily.  Patient advised to purchase a blood pressure cuff and monitor blood pressure at home at least 3 times per week until our next visit in about a month.

## 2024-10-24 NOTE — Progress Notes (Signed)
 Date:  10/24/2024   Name:  Steven Baldwin   DOB:  [birth year]   MRN:  [id number]   Chief Complaint: Hypertension  HPI  Mathieu returns for 55-month follow-up on HTN and HLD.  He has been working to incorporate more fiber into his diet and has reduced intake of carbs.  He is fasting today for repeat lipids.  He does not monitor blood pressure at home and does not have a cuff to do so.  Last visit I referred him for his first ever colonoscopy, owing to the fact that he is 17 years overdue for screening.  However today he refuses colonoscopy but would be willing to proceed with Cologuard.  Medication list has been reviewed and updated.  Current Meds  Medication Sig   losartan  (COZAAR ) 50 MG tablet Take 1 tablet (50 mg total) by mouth daily.     Review of Systems  Patient Active Problem List   Diagnosis Date Noted   Deviated nasal septum 06/24/2024   Hyperlipidemia 11/24/2022   Insomnia 11/03/2022   Chronic migraine without aura without status migrainosus, not intractable 06/14/2022   Primary hypertension 06/06/2022   OSA (obstructive sleep apnea) 06/06/2022    No Known Allergies  Immunization History  Administered Date(s) Administered   Tdap 06/24/2024   Zoster Recombinant(Shingrix ) 06/24/2024, 08/19/2024    History reviewed. No pertinent surgical history.  Social History   Tobacco Use   Smoking status: Never   Smokeless tobacco: Never  Vaping Use   Vaping status: Never Used  Substance Use Topics   Alcohol use: Never   Drug use: Never    Family History  Problem Relation Age of Onset   Hypertension Mother    Kidney disease Father    Hypertension Sister         10/24/2024    8:34 AM 06/24/2024    1:22 PM  GAD 7 : Generalized Anxiety Score  Nervous, Anxious, on Edge 0 0  Control/stop worrying 0 0  Worry too much - different things 0 0  Trouble relaxing 0 0  Restless 0 0  Easily annoyed or irritable 0 0  Afraid - awful might happen 0 0  Total GAD  7 Score 0 0  Anxiety Difficulty Not difficult at all Not difficult at all       10/24/2024    8:34 AM 06/24/2024    1:22 PM 06/06/2022   10:59 AM  Depression screen PHQ 2/9  Decreased Interest 0 0 0  Down, Depressed, Hopeless 0 0 0  PHQ - 2 Score 0 0 0    BP Readings from Last 3 Encounters:  10/24/24 (!) 140/80  06/24/24 132/64  11/03/22 127/77    Wt Readings from Last 3 Encounters:  10/24/24 168 lb (76.2 kg)  06/24/24 171 lb (77.6 kg)  11/03/22 184 lb 3.2 oz (83.6 kg)    BP (!) 140/80 (Cuff Size: Normal)   Pulse (!) 54   Temp 98.5 F (36.9 C)   Ht 5' 7 (1.702 m)   Wt 168 lb (76.2 kg)   SpO2 100%   BMI 26.31 kg/m   Physical Exam Vitals and nursing note reviewed.  Constitutional:      Appearance: Normal appearance.     Comments: Patient seems very tired today  Cardiovascular:     Rate and Rhythm: Normal rate.  Pulmonary:     Effort: Pulmonary effort is normal.  Abdominal:     General: There is no distension.  Musculoskeletal:  General: Normal range of motion.  Skin:    General: Skin is warm and dry.  Neurological:     Mental Status: He is alert and oriented to person, place, and time.     Gait: Gait is intact.  Psychiatric:        Mood and Affect: Mood and affect normal.     Recent Labs     Component Value Date/Time   NA 140 06/24/2024 1421   K 4.0 06/24/2024 1421   CL 103 06/24/2024 1421   CO2 21 06/24/2024 1421   GLUCOSE 107 (H) 06/24/2024 1421   GLUCOSE 99 06/07/2022 0829   BUN 18 06/24/2024 1421   CREATININE 0.84 06/24/2024 1421   CREATININE 0.98 06/07/2022 0829   CALCIUM 9.2 06/24/2024 1421   PROT 7.2 06/24/2024 1421   ALBUMIN 4.7 06/24/2024 1421   AST 26 06/24/2024 1421   ALT 23 06/24/2024 1421   ALKPHOS 73 06/24/2024 1421   BILITOT 0.4 06/24/2024 1421   GFRNONAA >60 09/13/2020 1640   GFRAA >60 09/13/2020 1640    Lab Results  Component Value Date   WBC 5.9 06/24/2024   HGB 14.6 06/24/2024   HCT 45.4 06/24/2024   MCV 93  06/24/2024   PLT 207 06/24/2024   Lab Results  Component Value Date   HGBA1C 5.5 09/13/2020   Lab Results  Component Value Date   CHOL 189 06/24/2024   HDL 32 (L) 06/24/2024   LDLCALC 120 (H) 06/24/2024   TRIG 208 (H) 06/24/2024   CHOLHDL 5.9 (H) 06/24/2024   Lab Results  Component Value Date   TSH 1.840 06/24/2024      Assessment and Plan:  Primary hypertension Assessment & Plan: Continue losartan  50 mg daily.  Patient advised to purchase a blood pressure cuff and monitor blood pressure at home at least 3 times per week until our next visit in about a month.   Mixed hyperlipidemia Assessment & Plan: Check fasting lipids today.   Orders: -     Lipid panel  Screening for colon cancer -     Cologuard     Return in about 4 weeks (around 11/21/2024) for OV f/u HTN.    Rolan Hoyle, PA-C, DMSc, Nutritionist [facility] [phone]

## 2024-10-25 ENCOUNTER — Ambulatory Visit: Payer: Self-pay | Admitting: Physician Assistant

## 2024-10-25 LAB — LIPID PANEL
Chol/HDL Ratio: 4.8 ratio (ref 0.0–5.0)
Cholesterol, Total: 171 mg/dL (ref 100–199)
HDL: 36 mg/dL — ABNORMAL LOW (ref >39)
LDL Chol Calc (NIH): 118 mg/dL — ABNORMAL HIGH (ref 0–99)
Triglycerides: 94 mg/dL (ref 0–149)
VLDL Cholesterol Cal: 17 mg/dL (ref 5–40)

## 2024-10-31 NOTE — Progress Notes (Signed)
 Labs printed and mailed.  KP

## 2024-11-02 LAB — COLOGUARD

## 2024-11-17 LAB — COLOGUARD

## 2024-11-25 ENCOUNTER — Ambulatory Visit: Admitting: Physician Assistant

## 2024-11-26 ENCOUNTER — Encounter: Payer: Self-pay | Admitting: Physician Assistant

## 2024-12-02 ENCOUNTER — Telehealth: Payer: Self-pay

## 2024-12-02 NOTE — Telephone Encounter (Signed)
 Called pt spoke to his wife told her to call the 1-800 number to tell them to not send anymore kits. She verbalized understanding.  kp

## 2024-12-02 NOTE — Telephone Encounter (Signed)
 Copied from CRM [id number]. Topic: Clinical - Medical Advice >> Dec 02, 2024  8:42 AM Mercedes MATSU wrote: Reason for CRM: Patients wife called in saying they keep sending her husband a cologaurd and he completed twice already and the test were fine. Patients wife can be reached at [phone]. She wants to know what's going on.

## 2024-12-06 ENCOUNTER — Ambulatory Visit: Admitting: Physician Assistant

## 2024-12-06 ENCOUNTER — Encounter: Payer: Self-pay | Admitting: Physician Assistant

## 2024-12-06 VITALS — BP 124/76 | HR 61 | Temp 98.7°F | Ht 67.0 in | Wt 171.0 lb

## 2024-12-06 DIAGNOSIS — I10 Essential (primary) hypertension: Secondary | ICD-10-CM

## 2024-12-06 NOTE — Telephone Encounter (Signed)
 Apparently, his last 2 Cologuard kits were unsuccessful and could not be processed; the sample was not collected according to the specifications and instructions included in the kit. Please advise he collect another sample but closely follow the instructions.

## 2024-12-06 NOTE — Progress Notes (Signed)
 Date:  12/06/2024   Name:  Steven Baldwin   DOB:  1961-07-30   MRN:  [id number]   Chief Complaint: Hypertension (Does not check at home)  HPI  Jerame returns for 1 month follow-up on HTN presently taking losartan  50 mg daily. He has not been checking BP at home as advised.   Importantly, his last 2 Cologuard kits were unsuccessful and could not be processed; apparently the sample was not collected according to the specifications and instructions included in the kit.  Medication list has been reviewed and updated.  Active Medications[1]   Review of Systems  Patient Active Problem List   Diagnosis Date Noted   Deviated nasal septum 06/24/2024   Hyperlipidemia 11/24/2022   Insomnia 11/03/2022   Chronic migraine without aura without status migrainosus, not intractable 06/14/2022   Primary hypertension 06/06/2022   OSA (obstructive sleep apnea) 06/06/2022    Allergies[2]  Immunization History  Administered Date(s) Administered   Tdap 06/24/2024   Zoster Recombinant(Shingrix ) 06/24/2024, 08/19/2024    History reviewed. No pertinent surgical history.  Social History[3]  Family History  Problem Relation Age of Onset   Hypertension Mother    Kidney disease Father    Hypertension Sister         12/06/2024    8:38 AM 10/24/2024    8:34 AM 06/24/2024    1:22 PM  GAD 7 : Generalized Anxiety Score  Nervous, Anxious, on Edge 0 0 0  Control/stop worrying 0 0 0  Worry too much - different things 0 0 0  Trouble relaxing 0 0 0  Restless 0 0 0  Easily annoyed or irritable 0 0 0  Afraid - awful might happen 0 0 0  Total GAD 7 Score 0 0 0  Anxiety Difficulty Not difficult at all Not difficult at all Not difficult at all       12/06/2024    8:38 AM 10/24/2024    8:34 AM 06/24/2024    1:22 PM  Depression screen PHQ 2/9  Decreased Interest 0 0 0  Down, Depressed, Hopeless 0 0 0  PHQ - 2 Score 0 0 0    BP Readings from Last 3 Encounters:  12/06/24 124/76   10/24/24 (!) 140/80  06/24/24 132/64    Wt Readings from Last 3 Encounters:  12/06/24 171 lb (77.6 kg)  10/24/24 168 lb (76.2 kg)  06/24/24 171 lb (77.6 kg)    BP 124/76 (Cuff Size: Normal)   Pulse 61   Temp 98.7 F (37.1 C)   Ht 5' 7 (1.702 m)   Wt 171 lb (77.6 kg)   SpO2 98%   BMI 26.78 kg/m   Physical Exam Vitals and nursing note reviewed.  Constitutional:      Appearance: Normal appearance.  Cardiovascular:     Rate and Rhythm: Normal rate.  Pulmonary:     Effort: Pulmonary effort is normal.  Abdominal:     General: There is no distension.  Musculoskeletal:        General: Normal range of motion.  Skin:    General: Skin is warm and dry.  Neurological:     Mental Status: He is alert and oriented to person, place, and time.     Gait: Gait is intact.  Psychiatric:        Mood and Affect: Mood and affect normal.     Recent Labs     Component Value Date/Time   NA 140 06/24/2024 1421   K 4.0 06/24/2024  1421   CL 103 06/24/2024 1421   CO2 21 06/24/2024 1421   GLUCOSE 107 (H) 06/24/2024 1421   GLUCOSE 99 06/07/2022 0829   BUN 18 06/24/2024 1421   CREATININE 0.84 06/24/2024 1421   CREATININE 0.98 06/07/2022 0829   CALCIUM 9.2 06/24/2024 1421   PROT 7.2 06/24/2024 1421   ALBUMIN 4.7 06/24/2024 1421   AST 26 06/24/2024 1421   ALT 23 06/24/2024 1421   ALKPHOS 73 06/24/2024 1421   BILITOT 0.4 06/24/2024 1421   GFRNONAA >60 09/13/2020 1640   GFRAA >60 09/13/2020 1640    Lab Results  Component Value Date   WBC 5.9 06/24/2024   HGB 14.6 06/24/2024   HCT 45.4 06/24/2024   MCV 93 06/24/2024   PLT 207 06/24/2024   Lab Results  Component Value Date   HGBA1C 5.5 09/13/2020   Lab Results  Component Value Date   CHOL 171 10/24/2024   HDL 36 (L) 10/24/2024   LDLCALC 118 (H) 10/24/2024   TRIG 94 10/24/2024   CHOLHDL 4.8 10/24/2024   Lab Results  Component Value Date   TSH 1.840 06/24/2024      Assessment and Plan:  Primary  hypertension Assessment & Plan: Continue losartan  50 mg daily. I believe there is an element of white coat HTN. Patient advised to purchase a blood pressure cuff and monitor blood pressure at home at least once per week.   Reviewed the Seven S's of hypertension including soil (encouraged plant-forward whole food eating habits), salt (intake <2g), smoking, stimulants (e.g. caffeine), stress, sleep (quantity, quality, timing, snoring/OSA), and sedentary lifestyle (aim for 150 active minutes per week).       F/u 8m OV HTN   Rolan Hoyle, PA-C, DMSc, Nutritionist [facility] [phone]      [1]  Current Meds  Medication Sig   losartan  (COZAAR ) 50 MG tablet Take 1 tablet (50 mg total) by mouth daily.  [2] No Known Allergies [3]  Social History Tobacco Use   Smoking status: Never   Smokeless tobacco: Never  Vaping Use   Vaping status: Never Used  Substance Use Topics   Alcohol use: Never   Drug use: Never

## 2024-12-06 NOTE — Assessment & Plan Note (Signed)
 Continue losartan  50 mg daily. I believe there is an element of white coat HTN. Patient advised to purchase a blood pressure cuff and monitor blood pressure at home at least once per week.   Reviewed the Seven S's of hypertension including soil (encouraged plant-forward whole food eating habits), salt (intake <2g), smoking, stimulants (e.g. caffeine), stress, sleep (quantity, quality, timing, snoring/OSA), and sedentary lifestyle (aim for 150 active minutes per week).

## 2024-12-06 NOTE — Telephone Encounter (Signed)
 Spoke to wife went over the [employer] with her. Told her I would look into getting another test ordered. She verbalized understanding.  KP

## 2024-12-23 NOTE — Telephone Encounter (Signed)
 Spoke to wife she stated she has not received a 3rd test yet. Called [employer] they will send over another kit. They stated a 3rd one was sent out and delivered 11/23/24. Will send out another one.  KP

## 2025-01-04 LAB — COLOGUARD

## 2025-01-08 ENCOUNTER — Other Ambulatory Visit: Payer: Self-pay

## 2025-01-08 DIAGNOSIS — Z1211 Encounter for screening for malignant neoplasm of colon: Secondary | ICD-10-CM

## 2025-02-28 ENCOUNTER — Ambulatory Visit: Admitting: Physician Assistant

## 2025-03-07 ENCOUNTER — Ambulatory Visit: Admitting: Physician Assistant
